# Patient Record
Sex: Male | Born: 1950 | Race: White | Hispanic: No | Marital: Married | Smoking: Former smoker
Health system: Southern US, Community
[De-identification: ages and names within clinical notes are randomized; demographics above are authoritative.]

## PROBLEM LIST (undated history)

## (undated) DIAGNOSIS — E291 Testicular hypofunction: Secondary | ICD-10-CM

## (undated) DIAGNOSIS — I471 Supraventricular tachycardia: Secondary | ICD-10-CM

## (undated) DIAGNOSIS — M2550 Pain in unspecified joint: Secondary | ICD-10-CM

## (undated) DIAGNOSIS — I10 Essential (primary) hypertension: Secondary | ICD-10-CM

## (undated) DIAGNOSIS — G4700 Insomnia, unspecified: Secondary | ICD-10-CM

## (undated) DIAGNOSIS — E785 Hyperlipidemia, unspecified: Secondary | ICD-10-CM

## (undated) DIAGNOSIS — E119 Type 2 diabetes mellitus without complications: Secondary | ICD-10-CM

## (undated) DIAGNOSIS — E7800 Pure hypercholesterolemia, unspecified: Secondary | ICD-10-CM

## (undated) HISTORY — DX: Pain in unspecified joint: M25.50

## (undated) HISTORY — DX: Type 2 diabetes mellitus without complications: E11.9

## (undated) HISTORY — PX: KNEE SURGERY: SHX244

## (undated) HISTORY — DX: Hyperlipidemia, unspecified: E78.5

## (undated) HISTORY — DX: Testicular hypofunction: E29.1

## (undated) HISTORY — DX: Supraventricular tachycardia: I47.1

## (undated) HISTORY — DX: Pure hypercholesterolemia, unspecified: E78.00

## (undated) HISTORY — DX: Essential (primary) hypertension: I10

## (undated) HISTORY — DX: Insomnia, unspecified: G47.00

---

## 2003-09-13 ENCOUNTER — Ambulatory Visit (HOSPITAL_COMMUNITY): Admission: RE | Admit: 2003-09-13 | Discharge: 2003-09-13 | Payer: Self-pay | Admitting: Family Medicine

## 2005-03-04 ENCOUNTER — Ambulatory Visit (HOSPITAL_BASED_OUTPATIENT_CLINIC_OR_DEPARTMENT_OTHER): Admission: RE | Admit: 2005-03-04 | Discharge: 2005-03-04 | Payer: Self-pay | Admitting: Orthopedic Surgery

## 2005-03-04 ENCOUNTER — Ambulatory Visit (HOSPITAL_COMMUNITY): Admission: RE | Admit: 2005-03-04 | Discharge: 2005-03-04 | Payer: Self-pay | Admitting: Orthopedic Surgery

## 2010-01-17 ENCOUNTER — Encounter: Admission: RE | Admit: 2010-01-17 | Discharge: 2010-01-17 | Payer: Self-pay | Admitting: Family Medicine

## 2010-02-18 ENCOUNTER — Encounter: Admission: RE | Admit: 2010-02-18 | Discharge: 2010-02-18 | Payer: Self-pay | Admitting: Family Medicine

## 2010-10-18 NOTE — Op Note (Signed)
NAMEZAYDON, KINSER                ACCOUNT NO.:  [id number]   MEDICAL RECORD NO.:  [id number]          PATIENT TYPE:  AMB   LOCATION:  DSC                          FACILITY:  [facility]   PHYSICIAN:  Katy Fitch. Sypher, M.D. DATE OF BIRTH:  [birth year]   DATE OF PROCEDURE:  03/04/2005  DATE OF DISCHARGE:                                 OPERATIVE REPORT   PREOP DIAGNOSIS:  Chronic septic arthritis, right ring finger  metacarpophalangeal joint, status post wall punching accident with probable  soiling of metacarpal phalangeal joint on February 16, 2005, status post  two courses of oral antibiotic therapy without resolution of swelling, rubor  and pain.   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   OPERATION:  Arthrotomy of right ring finger metacarpophalangeal joint with  synovectomy, culture for aerobic, anaerobic, acid-fast and fungal growth as  well as placement of a through-and-through vessel loop drain.   OPERATING SURGEON:  Josephine Igo.   ASSISTANT:  Annye Rusk PA-C.   ANESTHESIA:  General by LMA, supervising anesthesiologist is Dr. Autumn Patty.   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   He had a history of losing control of a pipe wrench on February 16, 2005  with his hand following through against a construction wall. He sustained a  puncture wound to the dorsal aspect of his right ring finger  metacarpophalangeal joint. He was seen at the [facility] where x-rays  were obtained revealing no apparent fracture. He was not provided  antibiotics.   He subsequently developed swelling and was seen at Dr. Brynda Greathouse Harris's  office approximately 3 days post injury. Dr. Tiburcio Pea identified what appeared  to be an  early infection and placed him on oral Augmentin. His wound  continued to drain therefore he was placed on Levaquin and subsequently now  is referred for extremity orthopedic consult.   Mr. Agcaoili is an established patient of our practice having been seen in  1990s for evaluation and management of other hand pathology.   After informed consent he is brought to the operating room at this time due  to a high index of suspicion of chronic joint infection.   PROCEDURE:  Emet Rafanan is brought to the operating room and placed in  supine position on the table.   Following anesthesia consultation by Dr. Autumn Patty, general  anesthesia by LMA technique was induced. The right arm was then prepped with  Betadine soap and solution and sterilely draped. A pneumatic tourniquet was  applied to proximal brachium.   Following elevation of hand and exsanguination of forearm with an Esmarch  bandage, an arterial tourniquet on the proximal brachium was inflated to 230  mmHg. Procedure commenced with a 2.5 cm curvilinear incision directly over  the MP joint. Subcutaneous tissue was carefully divided identifying a large  collection of granulation tissues directly over the perforation site. There  was  a sinus tract extending from the subdermal space proximal to the  sagittal fibers of the radial aspect of the common extensor tendon to the  joint capsule. This was followed carefully with scissors dissection  revealing a collection of purulent and chronically infected appearing  tissues at the proximal dorsal aspect of the metacarpophalangeal joint  capsule.   This was cleaned with a rongeur and passed off for pathologic evaluation and  culture for aerobic, anaerobic, acid fast and fungal infection.   Careful inspection of the joint revealed a defect in the neck of the  metacarpal adjacent to the proximal dorsal articular surface of the  metacarpal head measuring 4 mm in width from radial to  ulnar, 3 mm from  distal to proximal and approximately 3 mm of depth. There was purulent  material present consistent with an early osteomyelitis process due to a  direct penetrating injury to the bone with the initial perforation.   This area was thoroughly curetted and then meticulously irrigated with a 20  mL syringe and a dental needle with high-pressure irrigation times more than  100 mL. The MP joint was then thoroughly lavaged with sterile saline and  cleaned of all pathologic appearing synovium with a rongeur.   To promote drainage from what appeared to be a chronically septic joint, we  placed a through-and-through vessel loop drain utilizing a fine hemostat and  a Carroll tendon passing forceps.   The vessel loop was brought through the palmar aspect of the volar plate of  the MP joint adjacent to the flexor tendon through the joint and out the  dorsal radial aspect of the joint at its proximal capsular reflection  proximal to the sagittal fibers supporting the extensor tendon.   The wound was then infiltrated with 2% lidocaine for postoperative analgesia  followed by a provisional closure of the wound with a running intradermal  through Prolene that will be tensioned several days from now once our  culture results have returned.   The hand was dressed with a voluminous sterile gauze dressing with dorsal  palmar plaster sandwich splints maintaining the fingers in safe position.   There were no apparent complications.      Katy Fitch Sypher, M.D.  Electronically Signed     RVS/MEDQ  D:  03/04/2005  T:  03/04/2005  Job:  [id number]   cc:   Holley Bouche, M.D.  Fax: [phone]

## 2011-10-19 IMAGING — US US EXTREM LOW*L* LIMITED
1 series · 14 of 24 positions shown · non-contrast
Comparison: None.

CLINICAL DATA: Pain in the left knee laterally for 1 week

ULTRASOUND LEFT LOWER EXTREMITY COMPLETE
TECHNIQUE: Ultrasound examination of the kneewas performed
including evaluation of the muscles, tendons, joint, and adjacent
soft tissues.

[Series 1: us extrem low*left* limited · 0.09mm/px · 24 acquisitions, 14 frames shown]
[im 1/24]
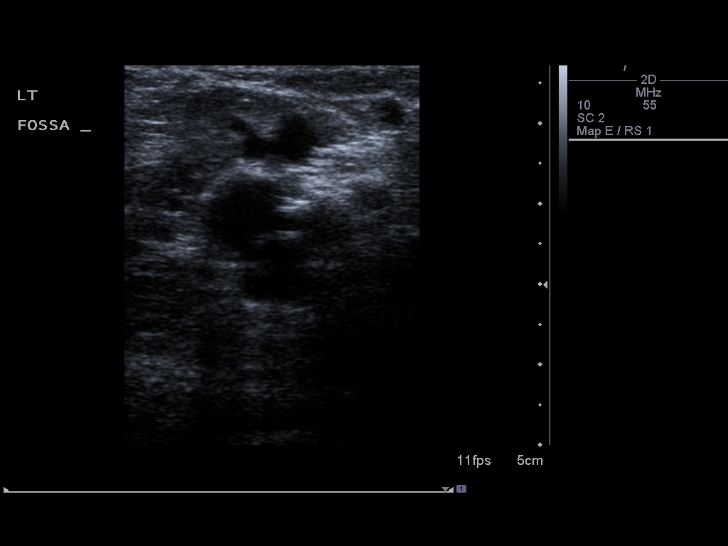
[im 3/24]
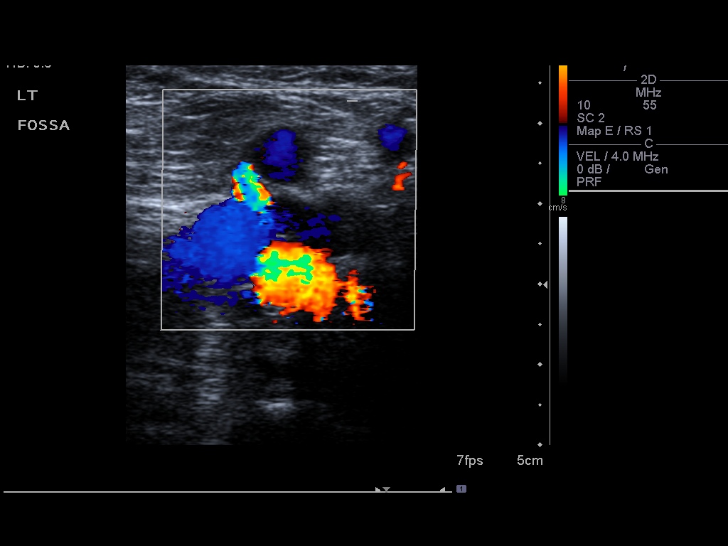
[im 5/24]
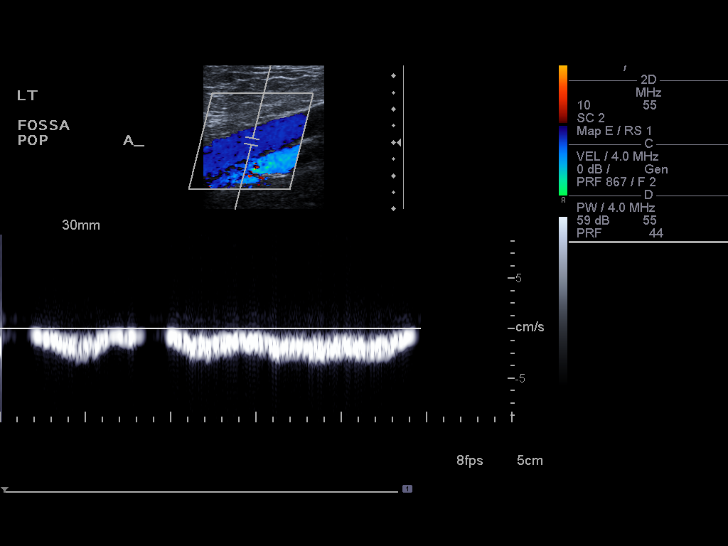
[im 7/24]
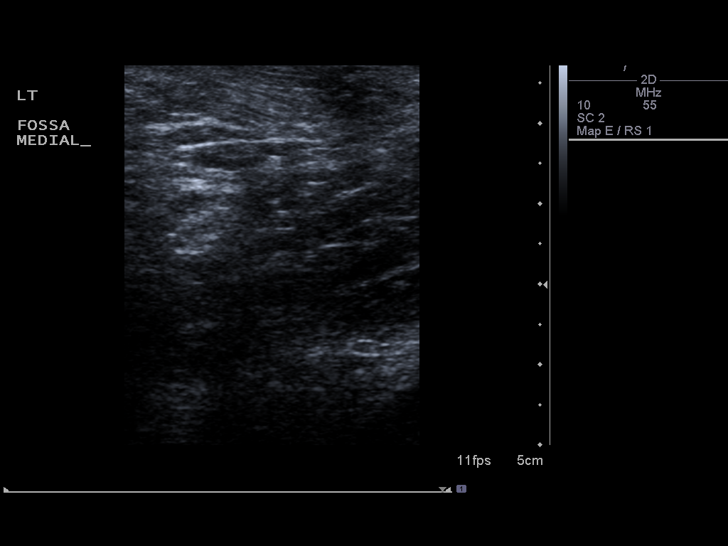
[im 8/24]
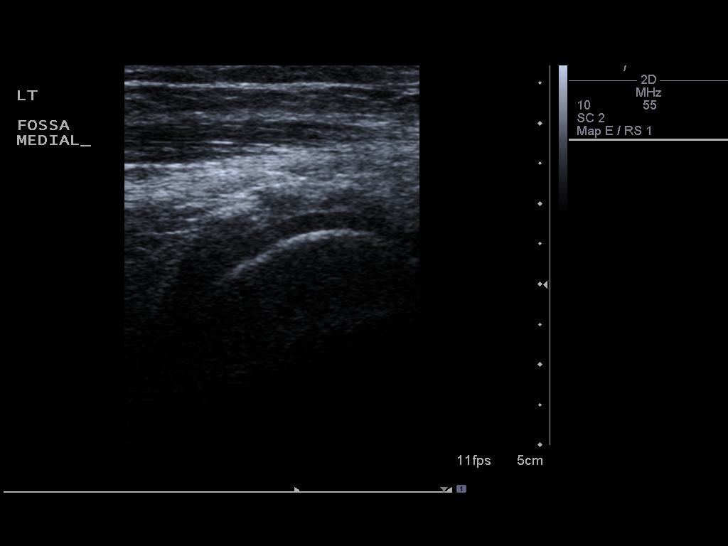
[im 10/24]
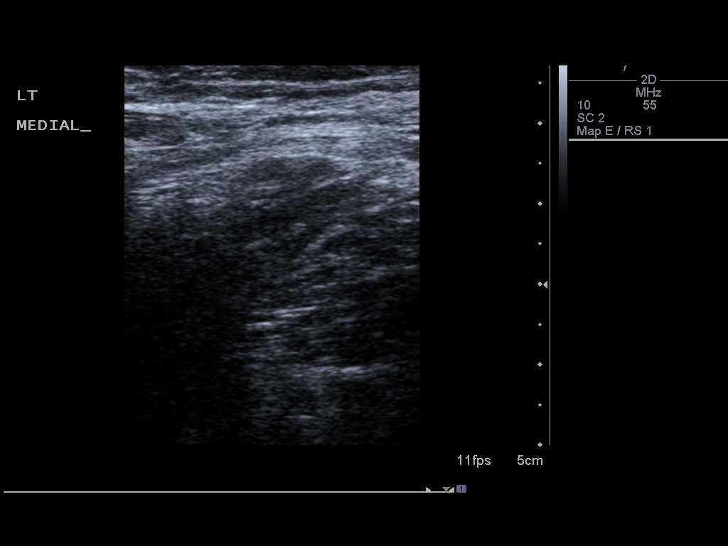
[im 12/24]
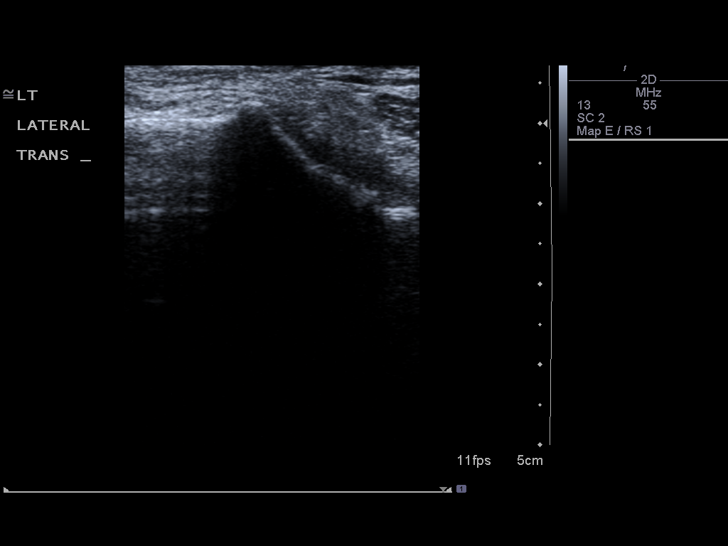
[im 13/24]
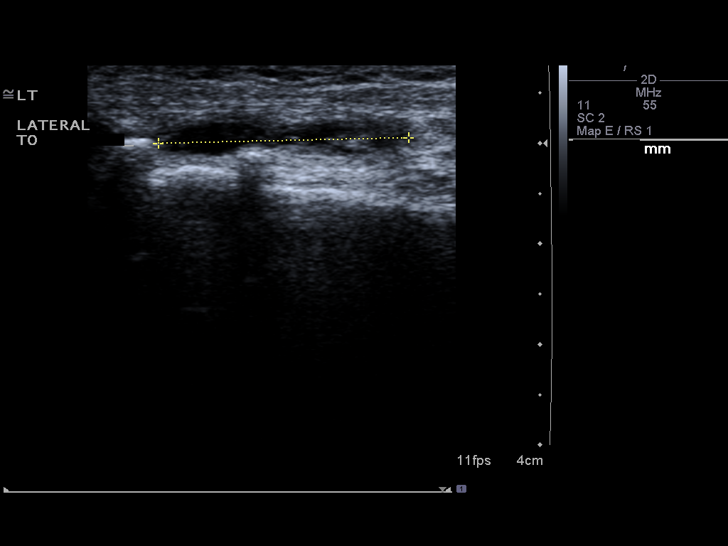
[im 15/24]
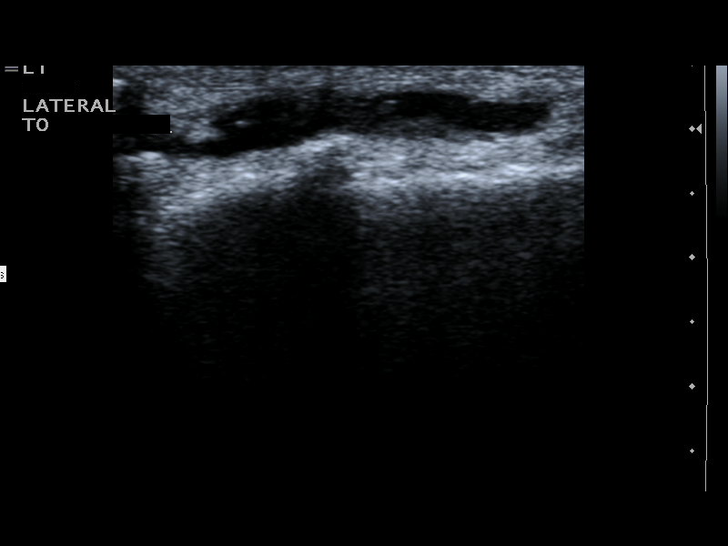
[im 17/24]
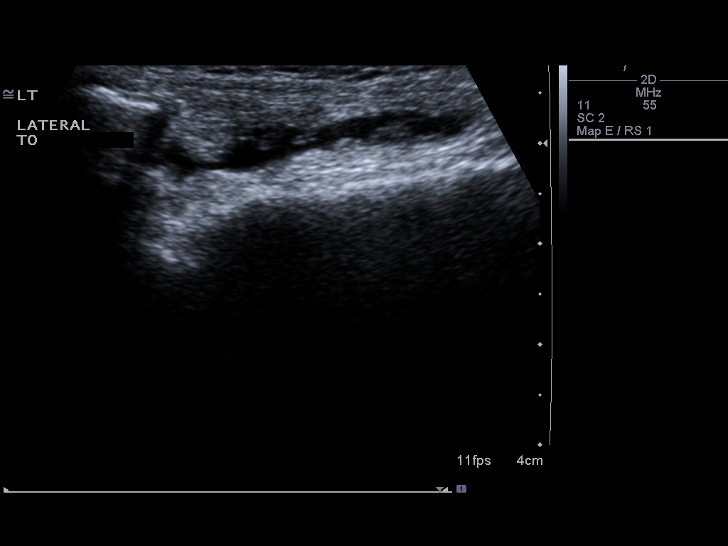
[im 19/24]
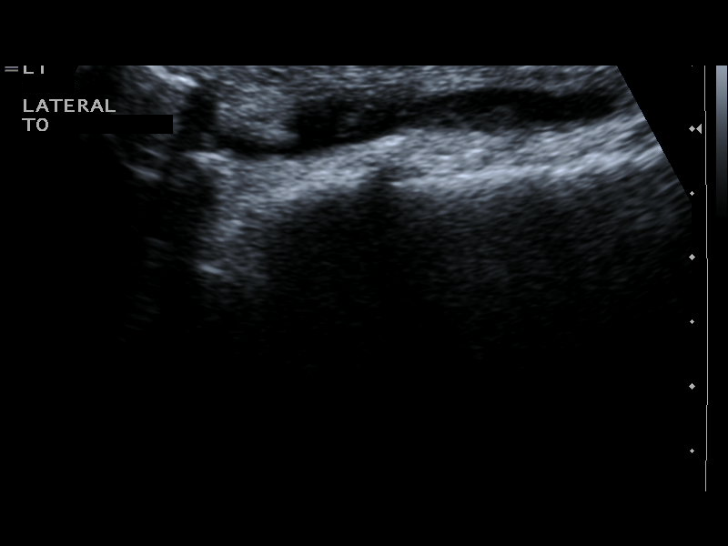
[im 20/24]
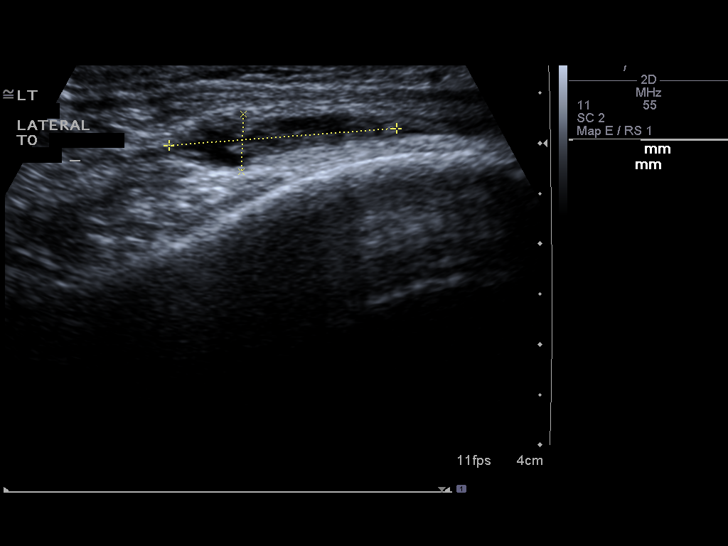
[im 22/24]
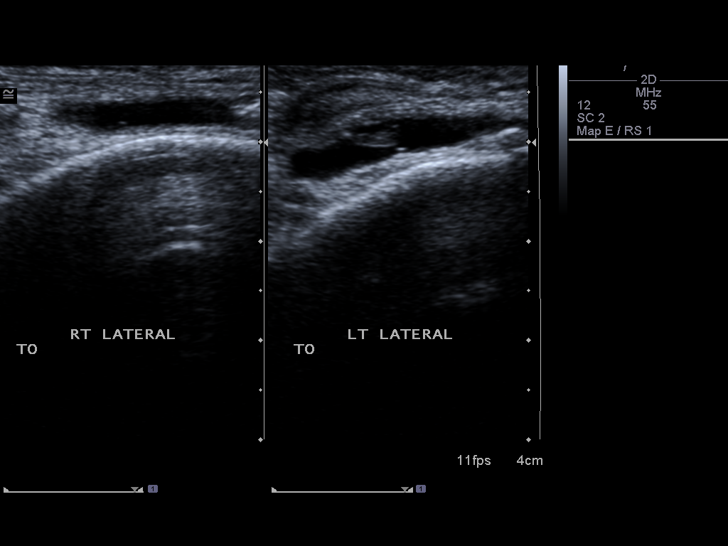
[im 24/24]
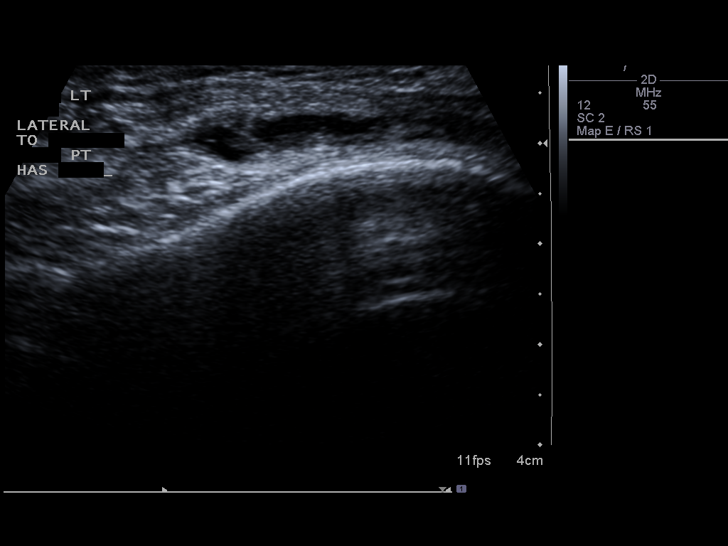

[14 of 24 positions shown; findings below may reference images not displayed]

FINDINGS: No evidence of left popliteal cyst is seen.  Just lateral
to the patella, there is a small complex fluid collection however
of 2.3 x 0.6 x 3.4 cm.  A tiny amount of fluid also is noted just
lateral to the right patella which may represent knee joint
effusion.
IMPRESSION: No popliteal cyst is seen on the left.  Probable small knee joint
effusions bilaterally.

## 2013-03-24 ENCOUNTER — Encounter: Payer: Self-pay | Admitting: Cardiology

## 2013-03-31 DIAGNOSIS — R002 Palpitations: Secondary | ICD-10-CM

## 2013-04-07 ENCOUNTER — Ambulatory Visit: Payer: Self-pay | Admitting: Cardiology

## 2013-04-23 ENCOUNTER — Encounter: Payer: Self-pay | Admitting: *Deleted

## 2013-04-23 ENCOUNTER — Encounter: Payer: Self-pay | Admitting: Cardiology

## 2013-04-25 ENCOUNTER — Encounter: Payer: Self-pay | Admitting: Cardiology

## 2013-04-25 ENCOUNTER — Ambulatory Visit (INDEPENDENT_AMBULATORY_CARE_PROVIDER_SITE_OTHER): Payer: BC Managed Care – PPO | Admitting: Cardiology

## 2013-04-25 VITALS — BP 120/80 | HR 91 | Ht 73.0 in | Wt 237.0 lb

## 2013-04-25 DIAGNOSIS — R9431 Abnormal electrocardiogram [ECG] [EKG]: Secondary | ICD-10-CM

## 2013-04-25 DIAGNOSIS — I471 Supraventricular tachycardia: Secondary | ICD-10-CM

## 2013-04-25 DIAGNOSIS — G479 Sleep disorder, unspecified: Secondary | ICD-10-CM

## 2013-04-25 DIAGNOSIS — E669 Obesity, unspecified: Secondary | ICD-10-CM | POA: Insufficient documentation

## 2013-04-25 DIAGNOSIS — I4719 Other supraventricular tachycardia: Secondary | ICD-10-CM | POA: Insufficient documentation

## 2013-04-25 DIAGNOSIS — I4891 Unspecified atrial fibrillation: Secondary | ICD-10-CM

## 2013-04-25 HISTORY — DX: Supraventricular tachycardia: I47.1

## 2013-04-25 LAB — TSH: TSH: 2.07 u[IU]/mL (ref 0.35–5.50)

## 2013-04-25 MED ORDER — VERAPAMIL HCL ER 120 MG PO TBCR: 120.0000 mg | EXTENDED_RELEASE_TABLET | Freq: Every day | ORAL | Status: DC

## 2013-04-25 NOTE — Progress Notes (Signed)
[address] [place], [place]  [address] Phone: [phone] Fax:  [phone]  Date:  04/25/2013   ID:  Ethan Miles, DOB Apr 22, 1951, MRN [id number]  PCP:  No primary provider on file.   History of Present Illness: Ethan Miles is a 62 y.o. male with recent discovery of paroxysmal atrial tachycardia, seen on event monitor. This was placed in September of 2014.  Approximately 20 years ago he had episodes of fluttering of his heart similar to what he is feeling now. At that time, weight reduction was treatment strategy by Dr. Idell Pickles. He actually went to several years without any issues and in September he began to feel his heart fluttering once again. He states that he will have short burst between 5 and 10 seconds of feeling his heart rate increase and then it will stop suddenly. He has not had any syncope, no dizziness, no chest pain, no shortness of breath with this. He is a nonsmoker, no alcohol. He has noted trouble with sleeping, waking up frequently most every hour. He drives quite frequently 35,000 miles a year. He drinks 2 cups of coffee a day and this has not changed. His insomnia has not changed either. He reports no thyroid issues.  Father had abdominal aortic aneurysm repair -immediate he following surgery received from blood type and died.   Wt Readings from Last 3 Encounters:  04/25/13 237 lb (107.502 kg)     Past Medical History  Diagnosis Date  . HTN (hypertension)   . Pain in joint   . Diabetes   . Hypercholesteremia   . Insomnia   . Hypogonadism male   . Hyperlipidemia     Past Surgical History  Procedure Laterality Date  . Knee surgery      Current Outpatient Prescriptions  Medication Sig Dispense Refill  . ANDROGEL PUMP 20.25 MG/ACT (1.62%) GEL Daily for testostrone      . aspirin 325 MG EC tablet Take 325 mg by mouth daily.      . Eszopiclone (ESZOPICLONE) 3 MG TABS Take 3 mg by mouth at bedtime. Take immediately before bedtime       . FREESTYLE LITE test strip       . lisinopril-hydrochlorothiazide (PRINZIDE,ZESTORETIC) 20-25 MG per tablet Take 1 tablet by mouth daily.      . metFORMIN (GLUCOPHAGE) 1000 MG tablet Take 1,000 mg by mouth 2 (two) times daily with a meal.      . VIAGRA 100 MG tablet prn       No current facility-administered medications for this visit.    Allergies:    Allergies  Allergen Reactions  . Naproxen     Gives him a upset stomach    Social History:  The patient  reports that he has quit smoking. He does not have any smokeless tobacco history on file.   Family History  Problem Relation Age of Onset  . Heart attack Brother   . Hypertension Mother   . Hypertension Father   . Stroke Mother     ROS:  Please see the history of present illness.   No shortness of breath, no fevers, no chills, no rashes, no strokelike symptoms, no bleeding, no orthopnea, no PND   All other systems reviewed and negative.   PHYSICAL EXAM: VS:  Ht 6\' 1"  (1.854 m)  Wt 237 lb (107.502 kg)  BMI 31.27 kg/m2 Well nourished, well developed, in no acute distress HEENT: normal, [place]/AT, EOMI  Neck: no JVD, normal carotid upstroke, no bruit Cardiac:  normal S1, S2; RRR; no murmur Lungs:  clear to auscultation bilaterally, no wheezing, rhonchi or rales Abd: soft, nontender, no hepatomegaly, no bruits Ext: no edema, 2+ distal pulses Skin: warm and dry GU: deferred Neuro: no focal abnormalities noted, AAO x 3  Event monitor-9/25 through 10/23-occasional episodes of paroxysmal atrial tachycardia heart rate usually in the 170 range.  ASSESSMENT AND PLAN:  1.  Paroxysmal atrial tachycardia-short bursts, 5-10 seconds, heart rate ranging between 160 and 170. I've reviewed with electrophysiology, Dr. Graciela Husbands. He does not appear to be atrial flutter or fibrillation. He has not had any prior strokes. I will start verapamil SR 120 mg once a day. Check echocardiogram given EKG abnormality. I will check a TSH. I will see him  back in 2 months to see if this helps with his bursts of PAT.  2. Abnormal EKG-as described above. PSVT. 3. Obesity-continue to advocate for weight loss.  4. Sleep disorder-insomnia/frequent waking up. Strongly consider sleep study which can correlate with heart arrhythmia.  5. I will see him back in 2 months.  Signed, Donato Schultz, MD [facility]  04/25/2013 1:58 PM

## 2013-04-25 NOTE — Patient Instructions (Signed)
Your physician has recommended you make the following change in your medication:   1. Start Verapamil 120mg  once daily  Your physician recommends that you have labs today: TSH  Your physician has requested that you have an echocardiogram. Echocardiography is a painless test that uses sound waves to create images of your heart. It provides your doctor with information about the size and shape of your heart and how well your heart's chambers and valves are working. This procedure takes approximately one hour. There are no restrictions for this procedure.  Your physician recommends that you schedule a follow-up appointment in:  2 months.

## 2013-05-06 ENCOUNTER — Ambulatory Visit (HOSPITAL_COMMUNITY): Payer: BC Managed Care – PPO | Attending: Cardiology | Admitting: Cardiology

## 2013-05-06 ENCOUNTER — Encounter: Payer: Self-pay | Admitting: Cardiology

## 2013-05-06 DIAGNOSIS — I4891 Unspecified atrial fibrillation: Secondary | ICD-10-CM

## 2013-05-06 DIAGNOSIS — Z6831 Body mass index (BMI) 31.0-31.9, adult: Secondary | ICD-10-CM | POA: Insufficient documentation

## 2013-05-06 DIAGNOSIS — I10 Essential (primary) hypertension: Secondary | ICD-10-CM | POA: Insufficient documentation

## 2013-05-06 DIAGNOSIS — I471 Supraventricular tachycardia: Secondary | ICD-10-CM

## 2013-05-06 DIAGNOSIS — E119 Type 2 diabetes mellitus without complications: Secondary | ICD-10-CM | POA: Insufficient documentation

## 2013-05-06 DIAGNOSIS — E669 Obesity, unspecified: Secondary | ICD-10-CM | POA: Insufficient documentation

## 2013-05-06 DIAGNOSIS — E785 Hyperlipidemia, unspecified: Secondary | ICD-10-CM | POA: Insufficient documentation

## 2013-05-06 DIAGNOSIS — R9431 Abnormal electrocardiogram [ECG] [EKG]: Secondary | ICD-10-CM

## 2013-05-06 NOTE — Progress Notes (Signed)
Echo performed. 

## 2013-05-07 ENCOUNTER — Encounter: Payer: Self-pay | Admitting: Cardiology

## 2013-06-21 ENCOUNTER — Telehealth: Payer: Self-pay | Admitting: Cardiology

## 2013-06-21 NOTE — Telephone Encounter (Signed)
New message           Pt started wearing monitor 02/22/13. Pt turned it into [facility] on 03/22/13. The practice has no evidence that he turned it in. Please call her back

## 2013-06-24 ENCOUNTER — Encounter: Payer: Self-pay | Admitting: Cardiology

## 2013-06-24 ENCOUNTER — Ambulatory Visit (INDEPENDENT_AMBULATORY_CARE_PROVIDER_SITE_OTHER): Payer: BC Managed Care – PPO | Admitting: Cardiology

## 2013-06-24 VITALS — BP 141/89 | HR 87 | Ht 73.0 in | Wt 245.4 lb

## 2013-06-24 DIAGNOSIS — E669 Obesity, unspecified: Secondary | ICD-10-CM

## 2013-06-24 DIAGNOSIS — I471 Supraventricular tachycardia: Secondary | ICD-10-CM

## 2013-06-24 MED ORDER — VERAPAMIL HCL ER 120 MG PO TBCR: 240.0000 mg | EXTENDED_RELEASE_TABLET | Freq: Every day | ORAL | Status: AC

## 2013-06-24 NOTE — Progress Notes (Signed)
[address] [place], [place]  [address] Phone: [phone] Fax:  [phone]  Date:  06/24/2013   ID:  Ethan GowdaKevin E Heying, DOB 09/13/1950, MRN [id number]  PCP:  No primary provider on file.   History of Present Illness: Ethan Miles is a 63 y.o. male with recent discovery of paroxysmal atrial tachycardia, seen on event monitor. This was placed in September of 2014.  Approximately 20 years ago he had episodes of fluttering of his heart similar to what he is feeling now. At that time, weight reduction was treatment strategy by Dr. Idell PicklesHeller. He actually went to several years without any issues and in September he began to feel his heart fluttering once again. He states that he will have short burst between 5 and 10 seconds of feeling his heart rate increase and then it will stop suddenly. He has not had any syncope, no dizziness, no chest pain, no shortness of breath with this. He is a nonsmoker, no alcohol. He has noted trouble with sleeping, waking up frequently most every hour. He drives quite frequently 35,000 miles a year. He drinks 2 cups of coffee a day and this has not changed. His insomnia has not changed either. He reports no thyroid issues.  Father had abdominal aortic aneurysm repair -immediate he following surgery received from blood type and died.  06/24/13-since being on verapamil, his palpitations have improved. Instead of having 5-10 second bursts of PAT, he is now having 2 second burst better less intense. No shortness of breath, no chest pain.   Wt Readings from Last 3 Encounters:  06/24/13 245 lb 6.4 oz (111.313 kg)  04/25/13 237 lb (107.502 kg)     Past Medical History  Diagnosis Date  . HTN (hypertension)   . Pain in joint   . Diabetes   . Hypercholesteremia   . Insomnia   . Hypogonadism male   . Hyperlipidemia   . PAT (paroxysmal atrial tachycardia) 04/25/2013    Past Surgical History  Procedure Laterality Date  . Knee surgery       Current Outpatient Prescriptions  Medication Sig Dispense Refill  . ANDROGEL PUMP 20.25 MG/ACT (1.62%) GEL Daily for testostrone      . aspirin 325 MG EC tablet Take 325 mg by mouth daily.      . Eszopiclone (ESZOPICLONE) 3 MG TABS Take 3 mg by mouth at bedtime. Take immediately before bedtime      . FREESTYLE LITE test strip       . lisinopril-hydrochlorothiazide (PRINZIDE,ZESTORETIC) 20-25 MG per tablet Take 1 tablet by mouth daily.      . metFORMIN (GLUCOPHAGE) 1000 MG tablet Take 1,000 mg by mouth 2 (two) times daily with a meal.      . verapamil (CALAN-SR) 120 MG CR tablet Take 1 tablet (120 mg total) by mouth daily.  30 tablet  6  . VIAGRA 100 MG tablet prn       No current facility-administered medications for this visit.    Allergies:    Allergies  Allergen Reactions  . Naproxen     Gives him a upset stomach    Social History:  The patient  reports that he has quit smoking. He does not have any smokeless tobacco history on file.   Family History  Problem Relation Age of Onset  . Heart attack Brother   . Hypertension Mother   . Hypertension Father   . Stroke Mother  ROS:  Please see the history of present illness.   No shortness of breath, no fevers, no chills, no rashes, no strokelike symptoms, no bleeding, no orthopnea, no PND   All other systems reviewed and negative.   PHYSICAL EXAM: VS:  BP 141/89  Pulse 87  Ht 6\' 1"  (1.854 m)  Wt 245 lb 6.4 oz (111.313 kg)  BMI 32.38 kg/m2 Well nourished, well developed, in no acute distress HEENT: normal, [place]/AT, EOMI Neck: no JVD, normal carotid upstroke, no bruit Cardiac:  normal S1, S2; RRR; no murmur Lungs:  clear to auscultation bilaterally, no wheezing, rhonchi or rales Abd: soft, nontender, no hepatomegaly, no bruits Ext: no edema, 2+ distal pulses Skin: warm and dry GU: deferred Neuro: no focal abnormalities noted, AAO x 3  Event monitor-9/25 through 10/23-occasional episodes of paroxysmal atrial  tachycardia heart rate usually in the 170 range.  ECHO: 05/06/13: Left ventricle: The cavity size was normal. Wall thickness was increased in a pattern of mild LVH. Systolic function was normal. The estimated ejection fraction was in the range of 55% to 60%. Wall motion was normal; there were no regional wall motion abnormalities. Doppler parameters are consistent with abnormal left ventricular relaxation (grade 1 diastolic dysfunction).     ASSESSMENT AND PLAN:  1.  Paroxysmal atrial tachycardia-short bursts, 5-10 seconds now improved to 2 seconds with rapid male SR 120 mg, (heart rate ranging between 160 and 170 during PAT). I've reviewed with electrophysiology, Dr. Graciela Husbands. He does not appear to be atrial flutter or fibrillation. He has not had any prior strokes. I will increase verapamil SR to 240 mg once a day. Reassuring echocardiogram. TSH normal. I will see him back in 6 months to see if this helps with his bursts of PAT.  2. Abnormal EKG-as described above. PSVT. ECHO reassuring.  3. Obesity-continue to advocate for weight loss.  4. Sleep disorder-insomnia/frequent waking up. Strongly consider sleep study which can correlate with heart arrhythmia.  5. I will see him back in 6 months.  Signed, Donato Schultz, MD [facility]  06/24/2013 4:41 PM

## 2013-06-24 NOTE — Patient Instructions (Signed)
Your physician has recommended you make the following change in your medication:   1. Increase Verapamil to 240 mg once daily.  Your physician wants you to follow-up in: 6 months with Dr. Anne FuSkains. You will receive a reminder letter in the mail two months in advance. If you don't receive a letter, please call our office to schedule the follow-up appointment.

## 2013-06-28 NOTE — Telephone Encounter (Signed)
Monitor most likely put on patient at [facility] prior to merger.  Message given to Theodoro KosKaty Bowman to investigate.

## 2015-12-06 DIAGNOSIS — E291 Testicular hypofunction: Secondary | ICD-10-CM | POA: Diagnosis not present

## 2015-12-06 DIAGNOSIS — E7800 Pure hypercholesterolemia, unspecified: Secondary | ICD-10-CM | POA: Diagnosis not present

## 2016-01-07 DIAGNOSIS — H25012 Cortical age-related cataract, left eye: Secondary | ICD-10-CM | POA: Diagnosis not present

## 2016-01-07 DIAGNOSIS — H524 Presbyopia: Secondary | ICD-10-CM | POA: Diagnosis not present

## 2016-01-07 DIAGNOSIS — H40012 Open angle with borderline findings, low risk, left eye: Secondary | ICD-10-CM | POA: Diagnosis not present

## 2016-01-07 DIAGNOSIS — H40011 Open angle with borderline findings, low risk, right eye: Secondary | ICD-10-CM | POA: Diagnosis not present

## 2016-01-07 DIAGNOSIS — H2513 Age-related nuclear cataract, bilateral: Secondary | ICD-10-CM | POA: Diagnosis not present

## 2016-01-07 DIAGNOSIS — E119 Type 2 diabetes mellitus without complications: Secondary | ICD-10-CM | POA: Diagnosis not present

## 2016-01-07 DIAGNOSIS — H40013 Open angle with borderline findings, low risk, bilateral: Secondary | ICD-10-CM | POA: Diagnosis not present

## 2016-02-19 DIAGNOSIS — G4700 Insomnia, unspecified: Secondary | ICD-10-CM | POA: Diagnosis not present

## 2016-02-19 DIAGNOSIS — Z23 Encounter for immunization: Secondary | ICD-10-CM | POA: Diagnosis not present

## 2016-02-19 DIAGNOSIS — E291 Testicular hypofunction: Secondary | ICD-10-CM | POA: Diagnosis not present

## 2016-02-19 DIAGNOSIS — I10 Essential (primary) hypertension: Secondary | ICD-10-CM | POA: Diagnosis not present

## 2016-02-19 DIAGNOSIS — E119 Type 2 diabetes mellitus without complications: Secondary | ICD-10-CM | POA: Diagnosis not present

## 2016-08-18 DIAGNOSIS — E119 Type 2 diabetes mellitus without complications: Secondary | ICD-10-CM | POA: Diagnosis not present

## 2016-08-18 DIAGNOSIS — E7800 Pure hypercholesterolemia, unspecified: Secondary | ICD-10-CM | POA: Diagnosis not present

## 2016-08-18 DIAGNOSIS — I10 Essential (primary) hypertension: Secondary | ICD-10-CM | POA: Diagnosis not present

## 2016-08-18 DIAGNOSIS — Z125 Encounter for screening for malignant neoplasm of prostate: Secondary | ICD-10-CM | POA: Diagnosis not present

## 2016-08-18 DIAGNOSIS — F5101 Primary insomnia: Secondary | ICD-10-CM | POA: Diagnosis not present

## 2016-08-18 DIAGNOSIS — E291 Testicular hypofunction: Secondary | ICD-10-CM | POA: Diagnosis not present

## 2016-12-02 DIAGNOSIS — E119 Type 2 diabetes mellitus without complications: Secondary | ICD-10-CM | POA: Diagnosis not present

## 2017-01-14 DIAGNOSIS — H40012 Open angle with borderline findings, low risk, left eye: Secondary | ICD-10-CM | POA: Diagnosis not present

## 2017-01-14 DIAGNOSIS — E119 Type 2 diabetes mellitus without complications: Secondary | ICD-10-CM | POA: Diagnosis not present

## 2017-01-14 DIAGNOSIS — H2513 Age-related nuclear cataract, bilateral: Secondary | ICD-10-CM | POA: Diagnosis not present

## 2017-01-14 DIAGNOSIS — H25013 Cortical age-related cataract, bilateral: Secondary | ICD-10-CM | POA: Diagnosis not present

## 2017-01-14 DIAGNOSIS — H40011 Open angle with borderline findings, low risk, right eye: Secondary | ICD-10-CM | POA: Diagnosis not present

## 2017-01-14 DIAGNOSIS — H40013 Open angle with borderline findings, low risk, bilateral: Secondary | ICD-10-CM | POA: Diagnosis not present

## 2017-02-26 DIAGNOSIS — E1165 Type 2 diabetes mellitus with hyperglycemia: Secondary | ICD-10-CM | POA: Diagnosis not present

## 2017-02-26 DIAGNOSIS — E291 Testicular hypofunction: Secondary | ICD-10-CM | POA: Diagnosis not present

## 2017-02-26 DIAGNOSIS — I10 Essential (primary) hypertension: Secondary | ICD-10-CM | POA: Diagnosis not present

## 2017-02-26 DIAGNOSIS — E7800 Pure hypercholesterolemia, unspecified: Secondary | ICD-10-CM | POA: Diagnosis not present

## 2017-02-26 DIAGNOSIS — Z23 Encounter for immunization: Secondary | ICD-10-CM | POA: Diagnosis not present

## 2017-07-13 DIAGNOSIS — H01003 Unspecified blepharitis right eye, unspecified eyelid: Secondary | ICD-10-CM | POA: Diagnosis not present

## 2017-07-13 DIAGNOSIS — H40033 Anatomical narrow angle, bilateral: Secondary | ICD-10-CM | POA: Diagnosis not present

## 2017-07-13 DIAGNOSIS — H029 Unspecified disorder of eyelid: Secondary | ICD-10-CM | POA: Diagnosis not present

## 2017-07-13 DIAGNOSIS — H40013 Open angle with borderline findings, low risk, bilateral: Secondary | ICD-10-CM | POA: Diagnosis not present

## 2017-08-31 DIAGNOSIS — E291 Testicular hypofunction: Secondary | ICD-10-CM | POA: Diagnosis not present

## 2017-08-31 DIAGNOSIS — Z125 Encounter for screening for malignant neoplasm of prostate: Secondary | ICD-10-CM | POA: Diagnosis not present

## 2017-08-31 DIAGNOSIS — E1165 Type 2 diabetes mellitus with hyperglycemia: Secondary | ICD-10-CM | POA: Diagnosis not present

## 2017-08-31 DIAGNOSIS — E7800 Pure hypercholesterolemia, unspecified: Secondary | ICD-10-CM | POA: Diagnosis not present

## 2017-08-31 DIAGNOSIS — G4700 Insomnia, unspecified: Secondary | ICD-10-CM | POA: Diagnosis not present

## 2017-08-31 DIAGNOSIS — I10 Essential (primary) hypertension: Secondary | ICD-10-CM | POA: Diagnosis not present

## 2017-12-04 DIAGNOSIS — E7800 Pure hypercholesterolemia, unspecified: Secondary | ICD-10-CM | POA: Diagnosis not present

## 2017-12-04 DIAGNOSIS — I10 Essential (primary) hypertension: Secondary | ICD-10-CM | POA: Diagnosis not present

## 2017-12-04 DIAGNOSIS — E1165 Type 2 diabetes mellitus with hyperglycemia: Secondary | ICD-10-CM | POA: Diagnosis not present

## 2017-12-04 DIAGNOSIS — E291 Testicular hypofunction: Secondary | ICD-10-CM | POA: Diagnosis not present

## 2018-01-18 DIAGNOSIS — H35033 Hypertensive retinopathy, bilateral: Secondary | ICD-10-CM | POA: Diagnosis not present

## 2018-01-18 DIAGNOSIS — H2513 Age-related nuclear cataract, bilateral: Secondary | ICD-10-CM | POA: Diagnosis not present

## 2018-01-18 DIAGNOSIS — H25013 Cortical age-related cataract, bilateral: Secondary | ICD-10-CM | POA: Diagnosis not present

## 2018-01-18 DIAGNOSIS — H40013 Open angle with borderline findings, low risk, bilateral: Secondary | ICD-10-CM | POA: Diagnosis not present

## 2018-02-22 DIAGNOSIS — R509 Fever, unspecified: Secondary | ICD-10-CM | POA: Diagnosis not present

## 2018-03-04 DIAGNOSIS — I10 Essential (primary) hypertension: Secondary | ICD-10-CM | POA: Diagnosis not present

## 2018-03-04 DIAGNOSIS — E7800 Pure hypercholesterolemia, unspecified: Secondary | ICD-10-CM | POA: Diagnosis not present

## 2018-03-04 DIAGNOSIS — E291 Testicular hypofunction: Secondary | ICD-10-CM | POA: Diagnosis not present

## 2018-03-04 DIAGNOSIS — Z23 Encounter for immunization: Secondary | ICD-10-CM | POA: Diagnosis not present

## 2018-03-04 DIAGNOSIS — E1165 Type 2 diabetes mellitus with hyperglycemia: Secondary | ICD-10-CM | POA: Diagnosis not present

## 2018-06-25 DIAGNOSIS — G4700 Insomnia, unspecified: Secondary | ICD-10-CM | POA: Diagnosis not present

## 2018-06-25 DIAGNOSIS — E291 Testicular hypofunction: Secondary | ICD-10-CM | POA: Diagnosis not present

## 2018-06-25 DIAGNOSIS — E1165 Type 2 diabetes mellitus with hyperglycemia: Secondary | ICD-10-CM | POA: Diagnosis not present

## 2018-06-25 DIAGNOSIS — I10 Essential (primary) hypertension: Secondary | ICD-10-CM | POA: Diagnosis not present

## 2018-08-03 DIAGNOSIS — H40033 Anatomical narrow angle, bilateral: Secondary | ICD-10-CM | POA: Diagnosis not present

## 2018-08-03 DIAGNOSIS — H01003 Unspecified blepharitis right eye, unspecified eyelid: Secondary | ICD-10-CM | POA: Diagnosis not present

## 2018-08-03 DIAGNOSIS — H40013 Open angle with borderline findings, low risk, bilateral: Secondary | ICD-10-CM | POA: Diagnosis not present

## 2018-09-16 DIAGNOSIS — I10 Essential (primary) hypertension: Secondary | ICD-10-CM | POA: Diagnosis not present

## 2018-09-16 DIAGNOSIS — E119 Type 2 diabetes mellitus without complications: Secondary | ICD-10-CM | POA: Diagnosis not present

## 2018-09-16 DIAGNOSIS — G4700 Insomnia, unspecified: Secondary | ICD-10-CM | POA: Diagnosis not present

## 2018-09-16 DIAGNOSIS — E291 Testicular hypofunction: Secondary | ICD-10-CM | POA: Diagnosis not present

## 2018-09-21 DIAGNOSIS — E7800 Pure hypercholesterolemia, unspecified: Secondary | ICD-10-CM | POA: Diagnosis not present

## 2018-09-21 DIAGNOSIS — Z125 Encounter for screening for malignant neoplasm of prostate: Secondary | ICD-10-CM | POA: Diagnosis not present

## 2018-09-21 DIAGNOSIS — E119 Type 2 diabetes mellitus without complications: Secondary | ICD-10-CM | POA: Diagnosis not present

## 2018-09-21 DIAGNOSIS — E1165 Type 2 diabetes mellitus with hyperglycemia: Secondary | ICD-10-CM | POA: Diagnosis not present

## 2018-09-21 DIAGNOSIS — I10 Essential (primary) hypertension: Secondary | ICD-10-CM | POA: Diagnosis not present

## 2018-09-21 DIAGNOSIS — E291 Testicular hypofunction: Secondary | ICD-10-CM | POA: Diagnosis not present

## 2018-12-31 DIAGNOSIS — I10 Essential (primary) hypertension: Secondary | ICD-10-CM | POA: Diagnosis not present

## 2018-12-31 DIAGNOSIS — E291 Testicular hypofunction: Secondary | ICD-10-CM | POA: Diagnosis not present

## 2018-12-31 DIAGNOSIS — G4700 Insomnia, unspecified: Secondary | ICD-10-CM | POA: Diagnosis not present

## 2018-12-31 DIAGNOSIS — E1165 Type 2 diabetes mellitus with hyperglycemia: Secondary | ICD-10-CM | POA: Diagnosis not present

## 2019-01-14 DIAGNOSIS — E1165 Type 2 diabetes mellitus with hyperglycemia: Secondary | ICD-10-CM | POA: Diagnosis not present

## 2019-02-10 DIAGNOSIS — H40013 Open angle with borderline findings, low risk, bilateral: Secondary | ICD-10-CM | POA: Diagnosis not present

## 2019-02-10 DIAGNOSIS — H35361 Drusen (degenerative) of macula, right eye: Secondary | ICD-10-CM | POA: Diagnosis not present

## 2019-02-10 DIAGNOSIS — E119 Type 2 diabetes mellitus without complications: Secondary | ICD-10-CM | POA: Diagnosis not present

## 2019-02-10 DIAGNOSIS — H35033 Hypertensive retinopathy, bilateral: Secondary | ICD-10-CM | POA: Diagnosis not present

## 2019-03-23 DIAGNOSIS — E7800 Pure hypercholesterolemia, unspecified: Secondary | ICD-10-CM | POA: Diagnosis not present

## 2019-03-23 DIAGNOSIS — E1165 Type 2 diabetes mellitus with hyperglycemia: Secondary | ICD-10-CM | POA: Diagnosis not present

## 2019-03-23 DIAGNOSIS — E291 Testicular hypofunction: Secondary | ICD-10-CM | POA: Diagnosis not present

## 2019-03-23 DIAGNOSIS — I10 Essential (primary) hypertension: Secondary | ICD-10-CM | POA: Diagnosis not present

## 2024-03-01 ENCOUNTER — Other Ambulatory Visit (HOSPITAL_COMMUNITY): Payer: Self-pay

## 2024-03-01 MED ORDER — VERAPAMIL HCL ER 120 MG PO TBCR
120.0000 mg | EXTENDED_RELEASE_TABLET | Freq: Two times a day (BID) | ORAL | 11 refills | Status: AC
  Filled 2024-03-01: qty 60, 30d supply, fill #0
  Filled 2024-03-27: qty 20, 10d supply, fill #1
  Filled 2024-03-28: qty 35, 17d supply, fill #1
  Filled 2024-03-28: qty 25, 13d supply, fill #1
  Filled 2024-04-26: qty 60, 30d supply, fill #2
  Filled 2024-05-22: qty 60, 30d supply, fill #3
  Filled 2024-06-20: qty 60, 30d supply, fill #4

## 2024-03-28 ENCOUNTER — Other Ambulatory Visit: Payer: Self-pay

## 2024-03-28 ENCOUNTER — Other Ambulatory Visit (HOSPITAL_COMMUNITY): Payer: Self-pay
